# Patient Record
Sex: Male | Born: 1995 | Race: Black or African American | Hispanic: No | Marital: Single | Smoking: Never smoker
Health system: Southern US, Community
[De-identification: ages and names within clinical notes are randomized; demographics above are authoritative.]

## PROBLEM LIST (undated history)

## (undated) DIAGNOSIS — F909 Attention-deficit hyperactivity disorder, unspecified type: Secondary | ICD-10-CM

## (undated) DIAGNOSIS — J45909 Unspecified asthma, uncomplicated: Secondary | ICD-10-CM

---
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
## 2017-04-05 ENCOUNTER — Encounter (HOSPITAL_COMMUNITY): Payer: Self-pay | Admitting: Emergency Medicine

## 2017-04-05 ENCOUNTER — Ambulatory Visit (HOSPITAL_COMMUNITY)
Admission: RE | Admit: 2017-04-05 | Discharge: 2017-04-05 | Disposition: A | Discharge status: Home / Self Care | Payer: No Typology Code available for payment source | Attending: Psychiatry | Admitting: Psychiatry

## 2017-04-05 DIAGNOSIS — F333 Major depressive disorder, recurrent, severe with psychotic symptoms: Secondary | ICD-10-CM | POA: Insufficient documentation
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
## 2017-04-08 ENCOUNTER — Encounter (HOSPITAL_COMMUNITY): Payer: Self-pay | Admitting: Psychology

## 2017-04-08 ENCOUNTER — Encounter (INDEPENDENT_AMBULATORY_CARE_PROVIDER_SITE_OTHER): Payer: Self-pay

## 2017-04-08 ENCOUNTER — Ambulatory Visit (HOSPITAL_COMMUNITY): Payer: No Typology Code available for payment source | Admitting: Psychology

## 2017-04-08 DIAGNOSIS — F1220 Cannabis dependence, uncomplicated: Secondary | ICD-10-CM

## 2017-04-09 ENCOUNTER — Ambulatory Visit (HOSPITAL_COMMUNITY): Payer: No Typology Code available for payment source | Admitting: Licensed Clinical Social Worker

## 2017-04-11 ENCOUNTER — Ambulatory Visit (INDEPENDENT_AMBULATORY_CARE_PROVIDER_SITE_OTHER): Payer: No Typology Code available for payment source | Admitting: Licensed Clinical Social Worker

## 2017-04-11 ENCOUNTER — Encounter (HOSPITAL_COMMUNITY): Payer: Self-pay | Admitting: Licensed Clinical Social Worker

## 2017-04-11 ENCOUNTER — Ambulatory Visit (HOSPITAL_COMMUNITY): Payer: No Typology Code available for payment source | Admitting: Licensed Clinical Social Worker

## 2017-04-11 DIAGNOSIS — F1220 Cannabis dependence, uncomplicated: Secondary | ICD-10-CM

## 2017-04-11 NOTE — Progress Notes (Signed)
THERAPIST PROGRESS NOTE  Session Time: 12-12:50  Participation Level: Minimal  Behavioral Response: Casual and GuardedConfusedEuthymic  Type of Therapy: Individual Therapy  Treatment Goals addressed: Diagnosis: SUD  Interventions: Motivational Interviewing  Summary: Aaron Mills is a 21 y.o. male who presents with strong ambivalence and lack of motivation to change his daily marijuana use and "xbox addiction". Pt recently left college and described feeling mistreated by his parents who are "not helping me". Pt displays minimal engagement in session. Pt was agreeable to seeing counselor again to discuss ffurther tx.  Counselor also met with pt mother individually to discuss nature of problems at home.   Suicidal/Homicidal: Nowithout intent/plan  Therapist Response: Pt is extremely guarded and ambivalent about changing his drug use. He admits he wants to return to school at [employer] in the Fall. Counselor used MI to establish rapport and secure a follow-up appointment.   Plan: Return again in 1 weeks.  Diagnosis:    ICD-10-CM   1. Cannabis use disorder, severe, dependence ([place]) F12.20        Archie Balboa, LPCA 04/11/2017

## 2017-04-15 ENCOUNTER — Encounter (HOSPITAL_COMMUNITY): Payer: Self-pay | Admitting: Licensed Clinical Social Worker

## 2017-04-15 ENCOUNTER — Ambulatory Visit (INDEPENDENT_AMBULATORY_CARE_PROVIDER_SITE_OTHER): Payer: No Typology Code available for payment source | Admitting: Licensed Clinical Social Worker

## 2017-04-15 DIAGNOSIS — F1220 Cannabis dependence, uncomplicated: Secondary | ICD-10-CM
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
## 2017-04-22 ENCOUNTER — Ambulatory Visit (HOSPITAL_COMMUNITY): Payer: No Typology Code available for payment source | Admitting: Licensed Clinical Social Worker

## 2020-03-09 ENCOUNTER — Ambulatory Visit: Payer: No Typology Code available for payment source | Admitting: Podiatry

## 2020-03-09 ENCOUNTER — Other Ambulatory Visit: Payer: Self-pay

## 2021-05-31 ENCOUNTER — Ambulatory Visit (INDEPENDENT_AMBULATORY_CARE_PROVIDER_SITE_OTHER): Payer: No Typology Code available for payment source | Admitting: Podiatry

## 2021-05-31 ENCOUNTER — Other Ambulatory Visit: Payer: Self-pay

## 2021-05-31 ENCOUNTER — Encounter: Payer: Self-pay | Admitting: Podiatry

## 2021-05-31 DIAGNOSIS — B351 Tinea unguium: Secondary | ICD-10-CM | POA: Diagnosis not present

## 2021-05-31 DIAGNOSIS — L600 Ingrowing nail: Secondary | ICD-10-CM

## 2021-05-31 DIAGNOSIS — Z79899 Other long term (current) drug therapy: Secondary | ICD-10-CM | POA: Diagnosis not present

## 2021-05-31 MED ORDER — TERBINAFINE HCL 250 MG PO TABS: 250.0000 mg | ORAL_TABLET | Freq: Every day | ORAL | 0 refills | Status: AC

## 2021-05-31 NOTE — Patient Instructions (Signed)

## 2021-06-01 NOTE — Progress Notes (Signed)
Subjective:   Patient ID: Aaron Mills, male   DOB: 25 y.o.   MRN: [id number]   HPI Patient presents with very painful ingrown toenails of the big toes of both feet with the left nail being completely thickened and deformed and also has yellow nails of remaining nails with digital deformities that can become tender.  Patient states he is dealt with this for a long time and its been an ongoing process and patient does not smoke likes to be active   Review of Systems  All other systems reviewed and are negative.      Objective:  Physical Exam Vitals and nursing note reviewed.  Constitutional:      Appearance: He is well-developed.  Pulmonary:     Effort: Pulmonary effort is normal.  Musculoskeletal:        General: Normal range of motion.  Skin:    General: Skin is warm.  Neurological:     Mental Status: He is alert.    Neurovascular status intact muscle strength found to be adequate range of motion adequate.  Patient is found to have significant digital deformities bilateral with yellow discoloration of a mild nature nailbeds and is found to have a very incurvated painful right hallux lateral border with no redness drainage and the left hallux nail is completely damaged thickened and painful with palpation.  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  For the chronic ingrown toenails I recommended correction reviewed procedures with patient and he wants these corrected and for the other I have recommended oral antifungal therapy and liver function studies.  Patient wants to go this route signed consent form understanding the left hallux will be removed completely permanently and signed consent form and today  I infiltrated each hallux 60 mg Xylocaine Marcaine mixture sterile prep done and using sterile instrumentation remove the lateral border of the right hallux apply chemical 3 applications 30 seconds phenol followed by alcohol lavage sterile dressing and for the left I remove the entire nail and exposed the root applied chemical phenol 5 applications 30 seconds followed by alcohol lavage and sterile dressing.  Gave instructions on soaks he will get his liver function studies done and start Lamisil 1 pill/day for 90 days but I did explain the fact that the digital deformities are probably part of his problem
